# Patient Record
Sex: Female | Born: 2000 | Race: White | Hispanic: No | Marital: Single | Smoking: Never smoker
Health system: Southern US, Community
[De-identification: ages and names within clinical notes are randomized; demographics above are authoritative.]

---
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
## 2019-01-11 DIAGNOSIS — Z30011 Encounter for initial prescription of contraceptive pills: Secondary | ICD-10-CM | POA: Diagnosis not present

## 2019-02-07 DIAGNOSIS — Z20828 Contact with and (suspected) exposure to other viral communicable diseases: Secondary | ICD-10-CM | POA: Diagnosis not present

## 2019-03-15 DIAGNOSIS — Z23 Encounter for immunization: Secondary | ICD-10-CM | POA: Diagnosis not present

## 2019-03-15 DIAGNOSIS — Z20828 Contact with and (suspected) exposure to other viral communicable diseases: Secondary | ICD-10-CM | POA: Diagnosis not present

## 2019-04-12 DIAGNOSIS — Z20828 Contact with and (suspected) exposure to other viral communicable diseases: Secondary | ICD-10-CM | POA: Diagnosis not present

## 2019-06-20 DIAGNOSIS — Z20828 Contact with and (suspected) exposure to other viral communicable diseases: Secondary | ICD-10-CM | POA: Diagnosis not present

## 2020-06-14 DIAGNOSIS — Z789 Other specified health status: Secondary | ICD-10-CM | POA: Diagnosis not present

## 2021-03-29 DIAGNOSIS — Z23 Encounter for immunization: Secondary | ICD-10-CM | POA: Diagnosis not present
